# Patient Record
Sex: Male | Born: 1985 | Race: White | Hispanic: No | Marital: Single | Smoking: Never smoker
Health system: Southern US, Community
[De-identification: ages and names within clinical notes are randomized; demographics above are authoritative.]

---

## 2018-12-01 ENCOUNTER — Other Ambulatory Visit: Payer: Self-pay

## 2018-12-01 ENCOUNTER — Emergency Department (HOSPITAL_COMMUNITY): Payer: 59

## 2018-12-01 ENCOUNTER — Encounter (HOSPITAL_COMMUNITY): Payer: Self-pay

## 2018-12-01 ENCOUNTER — Emergency Department (HOSPITAL_COMMUNITY)
Admission: EM | Admit: 2018-12-01 | Discharge: 2018-12-02 | Disposition: A | Discharge status: Home / Self Care | Payer: 59 | Attending: Emergency Medicine | Admitting: Emergency Medicine

## 2018-12-01 DIAGNOSIS — Z23 Encounter for immunization: Secondary | ICD-10-CM | POA: Diagnosis not present

## 2018-12-01 DIAGNOSIS — Y929 Unspecified place or not applicable: Secondary | ICD-10-CM | POA: Insufficient documentation

## 2018-12-01 DIAGNOSIS — S40211A Abrasion of right shoulder, initial encounter: Secondary | ICD-10-CM | POA: Diagnosis not present

## 2018-12-01 DIAGNOSIS — Y9355 Activity, bike riding: Secondary | ICD-10-CM | POA: Insufficient documentation

## 2018-12-01 DIAGNOSIS — Y999 Unspecified external cause status: Secondary | ICD-10-CM | POA: Insufficient documentation

## 2018-12-01 DIAGNOSIS — S80211A Abrasion, right knee, initial encounter: Secondary | ICD-10-CM | POA: Diagnosis not present

## 2018-12-01 DIAGNOSIS — T07XXXA Unspecified multiple injuries, initial encounter: Secondary | ICD-10-CM | POA: Diagnosis present

## 2018-12-01 DIAGNOSIS — S43101A Unspecified dislocation of right acromioclavicular joint, initial encounter: Secondary | ICD-10-CM | POA: Diagnosis not present

## 2018-12-01 MED ORDER — KETOROLAC TROMETHAMINE 15 MG/ML IJ SOLN
15.0000 mg | Freq: Once | INTRAMUSCULAR | Status: AC
  Administered 2018-12-02: 15 mg via INTRAVENOUS
  Filled 2018-12-01: qty 1

## 2018-12-01 MED ORDER — HYDROCODONE-ACETAMINOPHEN 5-325 MG PO TABS: 1.0000 | ORAL_TABLET | Freq: Four times a day (QID) | ORAL | 0 refills | Status: DC | PRN

## 2018-12-01 MED ORDER — TETANUS-DIPHTH-ACELL PERTUSSIS 5-2.5-18.5 LF-MCG/0.5 IM SUSP
0.5000 mL | Freq: Once | INTRAMUSCULAR | Status: AC
  Administered 2018-12-01: 0.5 mL via INTRAMUSCULAR
  Filled 2018-12-01: qty 0.5

## 2018-12-01 MED ORDER — HYDROMORPHONE HCL 1 MG/ML IJ SOLN
1.0000 mg | Freq: Once | INTRAMUSCULAR | Status: AC
  Administered 2018-12-01: 1 mg via INTRAVENOUS
  Filled 2018-12-01: qty 1

## 2018-12-01 MED ORDER — BACITRACIN ZINC 500 UNIT/GM EX OINT
TOPICAL_OINTMENT | Freq: Once | CUTANEOUS | Status: AC
  Administered 2018-12-02: via TOPICAL
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
## 2018-12-01 NOTE — Discharge Instructions (Addendum)
It was our pleasure to provide your ER care today - we hope that you feel better.  Ice to sore area. Wear shoulder slinger.   Keep abrasions clean and dry.   Take motrin or aleve as need for pain. You may also take hydrocodone as need for pain. No driving for the next 6 hours or when taking hydrocodone. Also, do not take tylenol or acetaminophen containing medication when taking hydrocodone.  Follow up with orthopedist in the next few days - call office tomorrow AM to arrange appointment.   Return to ER if worse, new symptoms, intractable pain, new or severe pain, numbness/weakness, severe headache, vomiting, other concern.
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
## 2018-12-02 ENCOUNTER — Ambulatory Visit: Payer: Self-pay

## 2018-12-02 ENCOUNTER — Ambulatory Visit (INDEPENDENT_AMBULATORY_CARE_PROVIDER_SITE_OTHER): Payer: 59 | Admitting: Family Medicine

## 2018-12-02 ENCOUNTER — Encounter: Payer: Self-pay | Admitting: Family Medicine

## 2018-12-02 DIAGNOSIS — S43101A Unspecified dislocation of right acromioclavicular joint, initial encounter: Secondary | ICD-10-CM

## 2018-12-02 DIAGNOSIS — M25511 Pain in right shoulder: Secondary | ICD-10-CM | POA: Diagnosis not present
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
## 2018-12-02 NOTE — ED Notes (Signed)
Pt discharged from ED; instructions provided and scripts given; Pt encouraged to return to ED if symptoms worsen and to f/u with PCP; Pt verbalized understanding of all instructions 

## 2018-12-30 ENCOUNTER — Ambulatory Visit (INDEPENDENT_AMBULATORY_CARE_PROVIDER_SITE_OTHER): Payer: 59 | Admitting: Family Medicine

## 2018-12-30 ENCOUNTER — Encounter: Payer: Self-pay | Admitting: Family Medicine

## 2018-12-30 DIAGNOSIS — S43101D Unspecified dislocation of right acromioclavicular joint, subsequent encounter: Secondary | ICD-10-CM | POA: Diagnosis not present
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
## 2021-06-04 IMAGING — CR RIGHT SHOULDER - 2+ VIEW
2 series · 2 of 2 positions shown · non-contrast
Comparison: None.

CLINICAL DATA: Fall with shoulder pain

EXAM:
RIGHT SHOULDER - 2+ VIEW

[shoulder grashey]
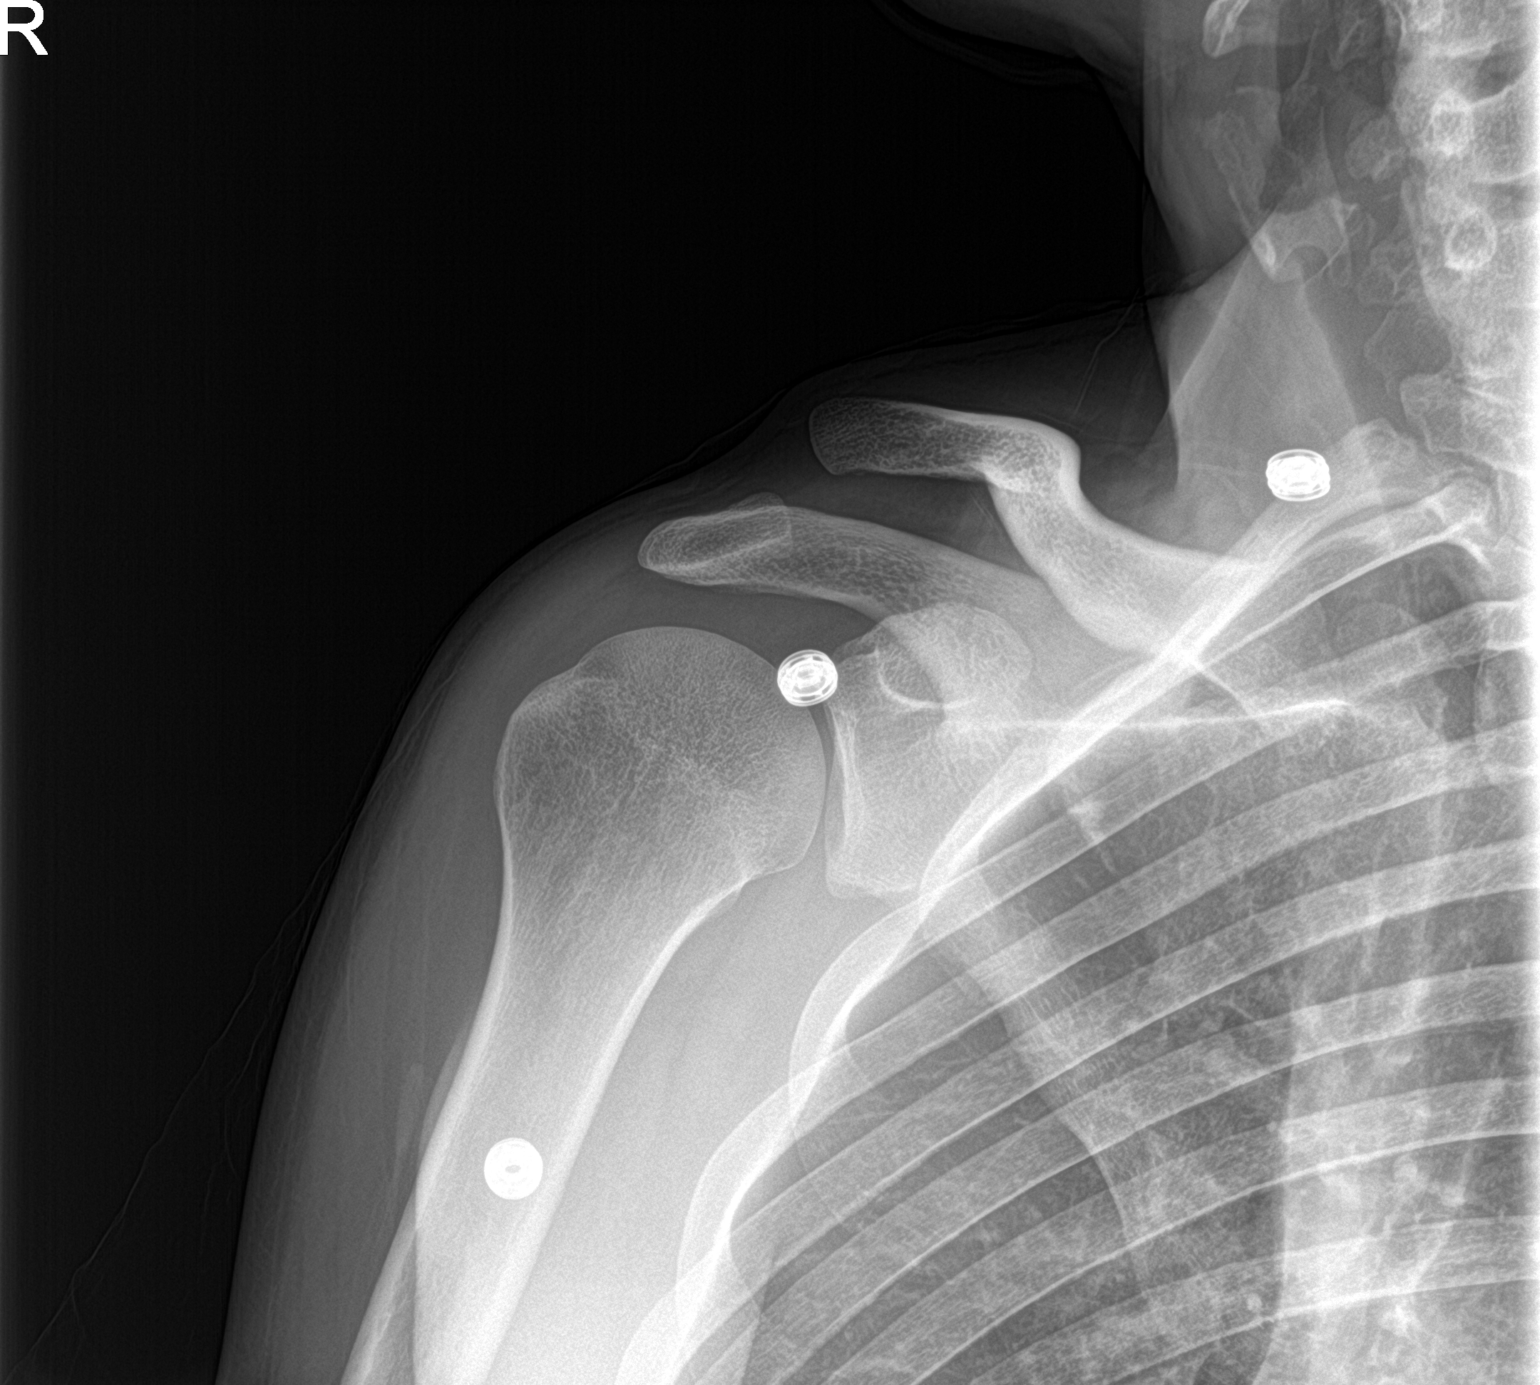

[shoulder y view]
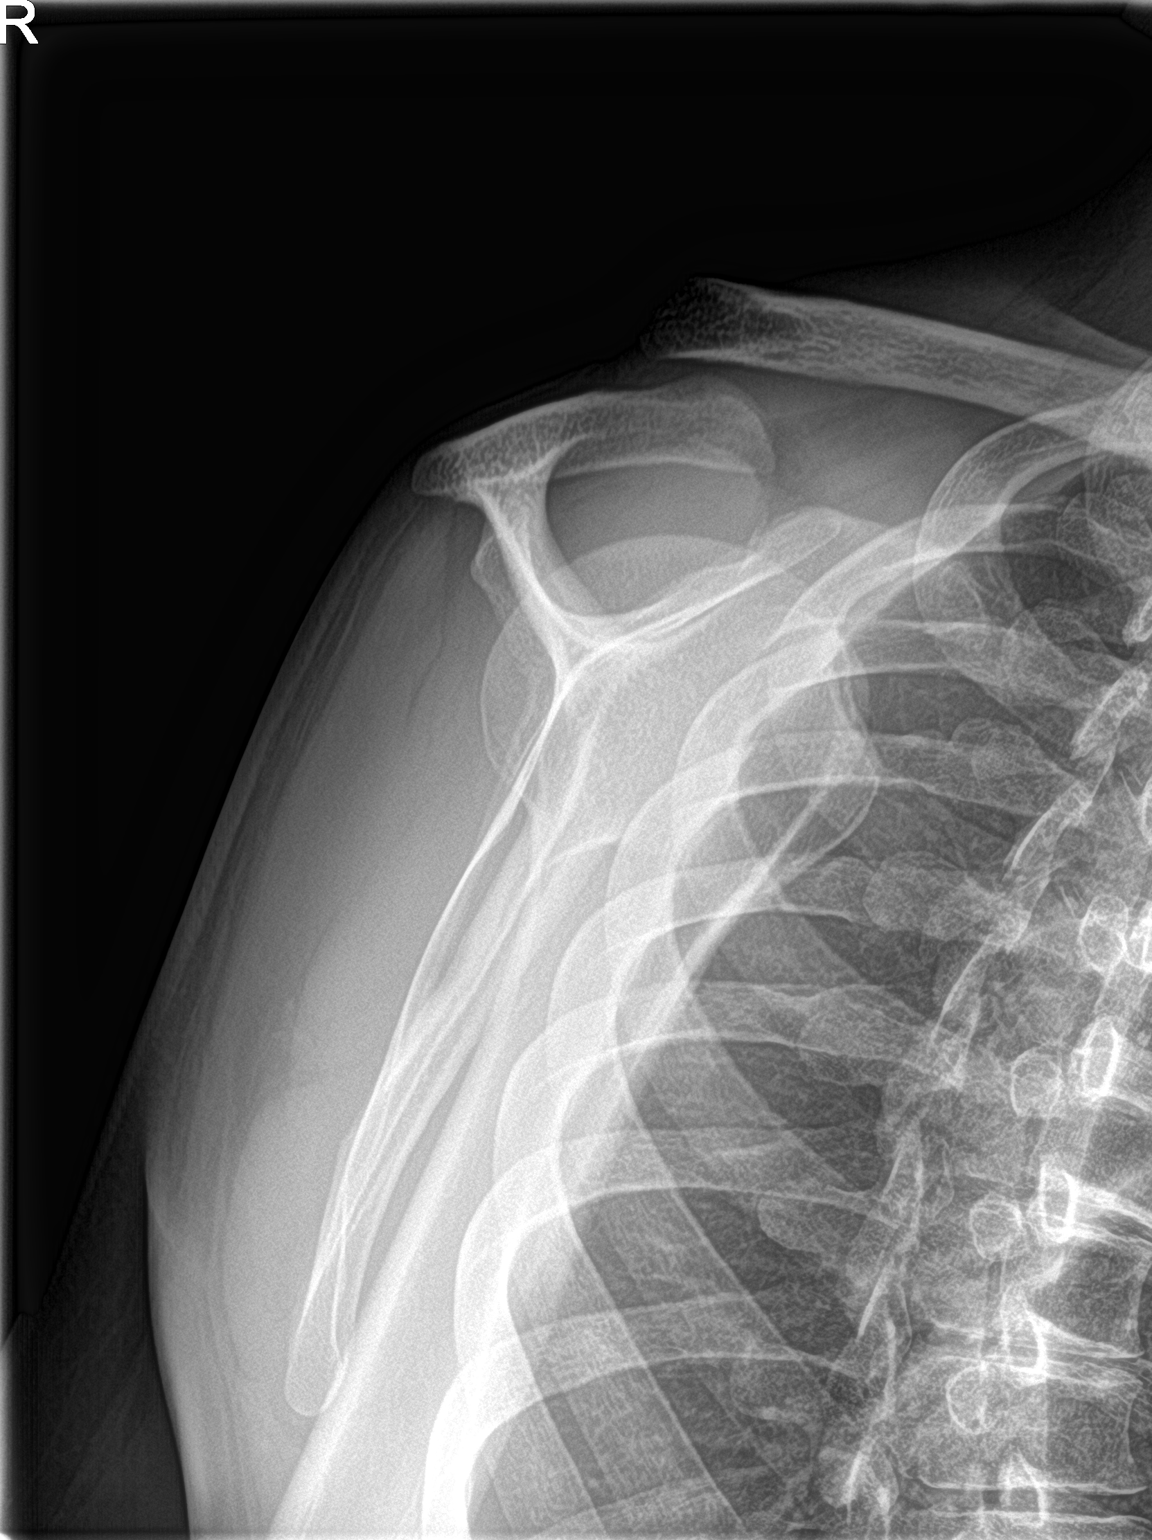

[2 of 2 positions shown; findings below may reference images not displayed]

FINDINGS: No fracture identified. AC joint separation with 1 bone with
superior elevation of distal end of the clavicle with respect to the
acromion. Widening of the joint space up to 11 mm. Glenohumeral
interval appears intact.
IMPRESSION: AC joint separation

## 2021-10-31 ENCOUNTER — Encounter (HOSPITAL_COMMUNITY): Payer: Self-pay

## 2021-10-31 ENCOUNTER — Ambulatory Visit (HOSPITAL_COMMUNITY)
Admission: EM | Admit: 2021-10-31 | Discharge: 2021-10-31 | Disposition: A | Discharge status: Home / Self Care | Payer: 59 | Attending: Emergency Medicine | Admitting: Emergency Medicine

## 2021-10-31 ENCOUNTER — Encounter (HOSPITAL_COMMUNITY): Payer: Self-pay | Admitting: Emergency Medicine

## 2021-10-31 DIAGNOSIS — K219 Gastro-esophageal reflux disease without esophagitis: Secondary | ICD-10-CM

## 2021-10-31 MED ORDER — PANTOPRAZOLE SODIUM 40 MG PO TBEC: 40.0000 mg | DELAYED_RELEASE_TABLET | Freq: Every day | ORAL | 2 refills | Status: AC

## 2021-10-31 NOTE — ED Provider Notes (Signed)
[facility]    CSN: [id number] Arrival date & time: 10/31/21  0825     History   Chief Complaint Chief Complaint  Patient presents with   Bloated    HPI Sean Mercado is a 36 y.o. male.  Presents with 3-week history of intermittent epigastric discomfort.  Couple times a week. Mostly at night when he lays down.  No pain, 3/10 discomfort. Does not notice any association between alcohol, caffeine, spicy or greasy foods.  He has tried Pepcid occasionally when he feels the symptoms but nothing consistently. No urinary symptoms. Normal BMs  Denies fever, chills, night sweats, early satiety, weight loss, abdominal pain, vomiting/diarrhea. Denies chest pain, shortness of breath.  History reviewed. No pertinent past medical history.  There are no problems to display for this patient.  History reviewed. No pertinent surgical history.   Home Medications    Prior to Admission medications   Medication Sig Start Date End Date Taking? Authorizing Provider  pantoprazole (PROTONIX) 40 MG tablet Take 1 tablet (40 mg total) by mouth daily. 10/31/21  Yes Iziah Cates, Lurena Joiner, PA-C  ibuprofen (ADVIL) 200 MG tablet Take 200 mg by mouth every 6 (six) hours as needed.    [provider]    Family History No family history on file.  Social History Social History   Tobacco Use   Smoking status: Never   Smokeless tobacco: Never  Substance Use Topics   Alcohol use: Yes    Comment: once a month   Drug use: Never     Allergies   Patient has no known allergies.   Review of Systems Review of Systems  Per HPI  Physical Exam Triage Vital Signs ED Triage Vitals  Enc Vitals Group     BP 10/31/21 0937 115/77     Pulse Rate 10/31/21 0937 71     Resp 10/31/21 0937 16     Temp 10/31/21 0937 98.2 F (36.8 C)     Temp src --      SpO2 10/31/21 0937 98 %     Weight --      Height --      Head Circumference --      Peak Flow --      Pain Score 10/31/21 0936 0     Pain  Loc --      Pain Edu? --      Excl. in GC? --    No data found.  Updated Vital Signs BP 115/77 (BP Location: Right Arm)   Pulse 71   Temp 98.2 F (36.8 C)   Resp 16   SpO2 98%    Physical Exam Vitals and nursing note reviewed.  Constitutional:      Appearance: Normal appearance.  HENT:     Mouth/Throat:     Mouth: Mucous membranes are moist.     Pharynx: Oropharynx is clear. No posterior oropharyngeal erythema.  Eyes:     Conjunctiva/sclera: Conjunctivae normal.  Cardiovascular:     Rate and Rhythm: Normal rate and regular rhythm.     Heart sounds: Normal heart sounds.  Pulmonary:     Effort: Pulmonary effort is normal. No respiratory distress.     Breath sounds: Normal breath sounds.  Abdominal:     General: Bowel sounds are normal.     Palpations: Abdomen is soft.     Tenderness: There is no abdominal tenderness. There is no guarding or rebound.  Musculoskeletal:        General: Normal range of  motion.  Skin:    General: Skin is warm and dry.  Neurological:     Mental Status: He is alert and oriented to person, place, and time.     UC Treatments / Results  Labs (all labs ordered are listed, but only abnormal results are displayed) Labs Reviewed - No data to display  EKG  Radiology No results found.  Procedures Procedures (including critical care time)  Medications Ordered in UC Medications - No data to display  Initial Impression / Assessment and Plan / UC Course  I have reviewed the triage vital signs and the nursing notes.  Pertinent labs & imaging results that were available during my care of the patient were reviewed by me and considered in my medical decision making (see chart for details).  I believe patient will benefit from a 2 or 3 week trial of daily Protonix.  Added him to primary care assistance list.  I have also provided GI provider information if he wants to follow-up with them.  Patient education regarding reduction of alcohol,  caffeine, spicy/greasy foods to see if symptoms improve.  Sitting up for at least 30 min after meals, drinking lots of water.  He understands to go to the emergency department if he feels his symptoms are worsening. Return precautions discussed. Patient agrees to plan and is discharged in stable condition.  Final Clinical Impressions(s) / UC Diagnoses   Final diagnoses:  Gastroesophageal reflux disease without esophagitis     Discharge Instructions      Please start taking the medicine daily. (Pantoprazole/Protonix)  Someone will reach out to you regarding establishing with a primary care provider in the area. I recommend to follow up with them if symptoms persist.  If symptoms worsen, please go to the emergency department.     ED Prescriptions     Medication Sig Dispense Auth. Provider   pantoprazole (PROTONIX) 40 MG tablet Take 1 tablet (40 mg total) by mouth daily. 30 tablet Althia Egolf, Lurena Joiner, PA-C      PDMP not reviewed this encounter.   Celestial Barnfield, Lurena Joiner, Cordelia Poche 10/31/21 1038

## 2021-10-31 NOTE — Discharge Instructions (Addendum)
Please start taking the medicine daily. (Pantoprazole/Protonix)  Someone will reach out to you regarding establishing with a primary care provider in the area. I recommend to follow up with them if symptoms persist.  If symptoms worsen, please go to the emergency department.

## 2021-10-31 NOTE — ED Triage Notes (Addendum)
Pt reports at night has bloating/tightness in epigastric area for about 3 weeks intermittently.  Tried taking Pepcid and take it away but will come back. Denies n/v, urinary or bowel problems.
